# Patient Record
Sex: Female | Born: 1980 | Race: White | Hispanic: No | Marital: Single | Smoking: Current every day smoker
Health system: Southern US, Community
[De-identification: ages and names within clinical notes are randomized; demographics above are authoritative.]

## PROBLEM LIST (undated history)

## (undated) DIAGNOSIS — K2970 Gastritis, unspecified, without bleeding: Secondary | ICD-10-CM

## (undated) DIAGNOSIS — E669 Obesity, unspecified: Secondary | ICD-10-CM

## (undated) DIAGNOSIS — F419 Anxiety disorder, unspecified: Secondary | ICD-10-CM

## (undated) DIAGNOSIS — E538 Deficiency of other specified B group vitamins: Secondary | ICD-10-CM

## (undated) DIAGNOSIS — F32A Depression, unspecified: Secondary | ICD-10-CM

## (undated) DIAGNOSIS — K2270 Barrett's esophagus without dysplasia: Secondary | ICD-10-CM

## (undated) DIAGNOSIS — Z8619 Personal history of other infectious and parasitic diseases: Secondary | ICD-10-CM

## (undated) DIAGNOSIS — K219 Gastro-esophageal reflux disease without esophagitis: Secondary | ICD-10-CM

## (undated) DIAGNOSIS — E559 Vitamin D deficiency, unspecified: Secondary | ICD-10-CM

## (undated) DIAGNOSIS — B019 Varicella without complication: Secondary | ICD-10-CM

## (undated) DIAGNOSIS — F329 Major depressive disorder, single episode, unspecified: Secondary | ICD-10-CM

## (undated) DIAGNOSIS — K589 Irritable bowel syndrome without diarrhea: Secondary | ICD-10-CM

## (undated) HISTORY — PX: WISDOM TOOTH EXTRACTION: SHX21

## (undated) HISTORY — PX: COLONOSCOPY: SHX174

---

## 1999-05-13 ENCOUNTER — Inpatient Hospital Stay (HOSPITAL_COMMUNITY): Admission: AD | Admit: 1999-05-13 | Discharge: 1999-05-13 | Payer: Self-pay | Admitting: *Deleted

## 1999-05-13 ENCOUNTER — Encounter: Payer: Self-pay | Admitting: *Deleted

## 2006-04-21 ENCOUNTER — Ambulatory Visit: Payer: Self-pay | Admitting: Family Medicine

## 2006-08-11 ENCOUNTER — Ambulatory Visit: Payer: Self-pay | Admitting: Gastroenterology

## 2006-12-15 ENCOUNTER — Ambulatory Visit: Payer: Self-pay | Admitting: Family Medicine

## 2007-08-24 ENCOUNTER — Ambulatory Visit: Payer: Self-pay | Admitting: Family Medicine

## 2009-05-24 ENCOUNTER — Encounter: Payer: Self-pay | Admitting: Specialist

## 2009-07-04 ENCOUNTER — Ambulatory Visit: Payer: Self-pay | Admitting: Specialist

## 2009-07-23 ENCOUNTER — Ambulatory Visit: Payer: Self-pay | Admitting: Unknown Physician Specialty

## 2009-08-14 ENCOUNTER — Ambulatory Visit: Payer: Self-pay | Admitting: Unknown Physician Specialty

## 2010-05-21 ENCOUNTER — Ambulatory Visit: Payer: Self-pay | Admitting: Neurology

## 2013-08-12 ENCOUNTER — Emergency Department: Payer: Self-pay | Admitting: Emergency Medicine

## 2013-08-12 LAB — COMPREHENSIVE METABOLIC PANEL
Albumin: 3.5 g/dL (ref 3.4–5.0)
Alkaline Phosphatase: 71 U/L
Anion Gap: 10 (ref 7–16)
BUN: 8 mg/dL (ref 7–18)
Bilirubin,Total: 0.3 mg/dL (ref 0.2–1.0)
Calcium, Total: 8.8 mg/dL (ref 8.5–10.1)
Chloride: 104 mmol/L (ref 98–107)
Co2: 24 mmol/L (ref 21–32)
Creatinine: 1.34 mg/dL — ABNORMAL HIGH (ref 0.60–1.30)
EGFR (African American): 60 — ABNORMAL LOW
EGFR (Non-African Amer.): 52 — ABNORMAL LOW
Glucose: 149 mg/dL — ABNORMAL HIGH (ref 65–99)
Osmolality: 277 (ref 275–301)
Potassium: 3 mmol/L — ABNORMAL LOW (ref 3.5–5.1)
SGOT(AST): 29 U/L (ref 15–37)
SGPT (ALT): 22 U/L (ref 12–78)
Sodium: 138 mmol/L (ref 136–145)
Total Protein: 7.9 g/dL (ref 6.4–8.2)

## 2013-08-12 LAB — URINALYSIS, COMPLETE
Bacteria: NONE SEEN
Bilirubin,UR: NEGATIVE
Glucose,UR: NEGATIVE mg/dL (ref 0–75)
Hyaline Cast: 13
Ketone: NEGATIVE
Nitrite: NEGATIVE
Ph: 5 (ref 4.5–8.0)
Protein: 100
RBC,UR: 186 /HPF (ref 0–5)
Specific Gravity: 1.026 (ref 1.003–1.030)
Squamous Epithelial: 8
WBC UR: 86 /HPF (ref 0–5)

## 2013-08-12 LAB — CBC WITH DIFFERENTIAL/PLATELET
Basophil #: 0.1 10*3/uL (ref 0.0–0.1)
Basophil %: 0.8 %
Eosinophil #: 0.1 10*3/uL (ref 0.0–0.7)
Eosinophil %: 1.1 %
HCT: 42.3 % (ref 35.0–47.0)
HGB: 14 g/dL (ref 12.0–16.0)
Lymphocyte #: 2.3 10*3/uL (ref 1.0–3.6)
Lymphocyte %: 27.2 %
MCH: 29.9 pg (ref 26.0–34.0)
MCHC: 33.2 g/dL (ref 32.0–36.0)
MCV: 90 fL (ref 80–100)
Monocyte #: 0.6 x10 3/mm (ref 0.2–0.9)
Monocyte %: 7 %
Neutrophil #: 5.4 10*3/uL (ref 1.4–6.5)
Neutrophil %: 63.9 %
Platelet: 239 10*3/uL (ref 150–440)
RBC: 4.69 10*6/uL (ref 3.80–5.20)
RDW: 12.9 % (ref 11.5–14.5)
WBC: 8.5 10*3/uL (ref 3.6–11.0)

## 2015-05-16 ENCOUNTER — Encounter: Payer: Self-pay | Admitting: *Deleted

## 2015-05-17 ENCOUNTER — Ambulatory Visit
Admission: RE | Admit: 2015-05-17 | Discharge: 2015-05-17 | Disposition: A | Discharge status: Home / Self Care | Payer: Managed Care, Other (non HMO) | Source: Ambulatory Visit | Attending: Gastroenterology | Admitting: Gastroenterology

## 2015-05-17 ENCOUNTER — Ambulatory Visit: Payer: Managed Care, Other (non HMO) | Admitting: Anesthesiology

## 2015-05-17 ENCOUNTER — Encounter
Admission: RE | Disposition: A | Discharge status: Home / Self Care | Payer: Self-pay | Source: Ambulatory Visit | Attending: Gastroenterology

## 2015-05-17 ENCOUNTER — Encounter: Payer: Self-pay | Admitting: Anesthesiology

## 2015-05-17 DIAGNOSIS — E559 Vitamin D deficiency, unspecified: Secondary | ICD-10-CM | POA: Insufficient documentation

## 2015-05-17 DIAGNOSIS — K589 Irritable bowel syndrome without diarrhea: Secondary | ICD-10-CM | POA: Insufficient documentation

## 2015-05-17 DIAGNOSIS — K210 Gastro-esophageal reflux disease with esophagitis: Secondary | ICD-10-CM | POA: Insufficient documentation

## 2015-05-17 DIAGNOSIS — E669 Obesity, unspecified: Secondary | ICD-10-CM | POA: Insufficient documentation

## 2015-05-17 DIAGNOSIS — E538 Deficiency of other specified B group vitamins: Secondary | ICD-10-CM | POA: Diagnosis not present

## 2015-05-17 DIAGNOSIS — Z6830 Body mass index (BMI) 30.0-30.9, adult: Secondary | ICD-10-CM | POA: Insufficient documentation

## 2015-05-17 DIAGNOSIS — F329 Major depressive disorder, single episode, unspecified: Secondary | ICD-10-CM | POA: Insufficient documentation

## 2015-05-17 DIAGNOSIS — K219 Gastro-esophageal reflux disease without esophagitis: Secondary | ICD-10-CM | POA: Diagnosis present

## 2015-05-17 DIAGNOSIS — K2950 Unspecified chronic gastritis without bleeding: Secondary | ICD-10-CM | POA: Diagnosis not present

## 2015-05-17 DIAGNOSIS — K319 Disease of stomach and duodenum, unspecified: Secondary | ICD-10-CM | POA: Insufficient documentation

## 2015-05-17 DIAGNOSIS — Z79899 Other long term (current) drug therapy: Secondary | ICD-10-CM | POA: Diagnosis not present

## 2015-05-17 DIAGNOSIS — R1013 Epigastric pain: Secondary | ICD-10-CM | POA: Diagnosis not present

## 2015-05-17 HISTORY — DX: Vitamin D deficiency, unspecified: E55.9

## 2015-05-17 HISTORY — DX: Major depressive disorder, single episode, unspecified: F32.9

## 2015-05-17 HISTORY — DX: Varicella without complication: B01.9

## 2015-05-17 HISTORY — DX: Depression, unspecified: F32.A

## 2015-05-17 HISTORY — DX: Deficiency of other specified B group vitamins: E53.8

## 2015-05-17 HISTORY — DX: Obesity, unspecified: E66.9

## 2015-05-17 HISTORY — PX: ESOPHAGOGASTRODUODENOSCOPY (EGD) WITH PROPOFOL: SHX5813

## 2015-05-17 HISTORY — DX: Irritable bowel syndrome without diarrhea: K58.9

## 2015-05-17 HISTORY — DX: Gastro-esophageal reflux disease without esophagitis: K21.9

## 2015-05-17 HISTORY — DX: Anxiety disorder, unspecified: F41.9

## 2015-05-17 LAB — POCT PREGNANCY, URINE: Preg Test, Ur: NEGATIVE

## 2015-05-17 SURGERY — ESOPHAGOGASTRODUODENOSCOPY (EGD) WITH PROPOFOL
Anesthesia: General

## 2015-05-17 MED ORDER — FENTANYL CITRATE (PF) 100 MCG/2ML IJ SOLN
INTRAMUSCULAR | Status: DC | PRN
  Administered 2015-05-17: 50 ug via INTRAVENOUS

## 2015-05-17 MED ORDER — SODIUM CHLORIDE 0.9 % IV SOLN: INTRAVENOUS | Status: DC

## 2015-05-17 MED ORDER — LIDOCAINE HCL (PF) 1 % IJ SOLN
2.0000 mL | Freq: Once | INTRAMUSCULAR | Status: AC
  Administered 2015-05-17: 0.03 mL via INTRADERMAL

## 2015-05-17 MED ORDER — LIDOCAINE HCL (PF) 1 % IJ SOLN
INTRAMUSCULAR | Status: AC
  Administered 2015-05-17: 0.03 mL via INTRADERMAL
  Filled 2015-05-17: qty 2

## 2015-05-17 MED ORDER — IPRATROPIUM-ALBUTEROL 0.5-2.5 (3) MG/3ML IN SOLN
RESPIRATORY_TRACT | Status: AC
  Administered 2015-05-17: 3 mL via RESPIRATORY_TRACT
  Filled 2015-05-17: qty 3

## 2015-05-17 MED ORDER — SODIUM CHLORIDE 0.9 % IV SOLN
INTRAVENOUS | Status: DC
  Administered 2015-05-17: 1000 mL via INTRAVENOUS

## 2015-05-17 MED ORDER — PROPOFOL 10 MG/ML IV BOLUS
INTRAVENOUS | Status: DC | PRN
  Administered 2015-05-17: 50 mg via INTRAVENOUS

## 2015-05-17 MED ORDER — GLYCOPYRROLATE 0.2 MG/ML IJ SOLN
INTRAMUSCULAR | Status: DC | PRN
  Administered 2015-05-17: 0.1 mg via INTRAVENOUS

## 2015-05-17 MED ORDER — MIDAZOLAM HCL 5 MG/5ML IJ SOLN
INTRAMUSCULAR | Status: DC | PRN
  Administered 2015-05-17: 1 mg via INTRAVENOUS

## 2015-05-17 MED ORDER — IPRATROPIUM-ALBUTEROL 0.5-2.5 (3) MG/3ML IN SOLN
3.0000 mL | Freq: Once | RESPIRATORY_TRACT | Status: AC
  Administered 2015-05-17: 3 mL via RESPIRATORY_TRACT

## 2015-05-17 MED ORDER — LIDOCAINE HCL (CARDIAC) 20 MG/ML IV SOLN
INTRAVENOUS | Status: DC | PRN
  Administered 2015-05-17: 30 mg via INTRAVENOUS

## 2015-05-17 MED ORDER — PROPOFOL 500 MG/50ML IV EMUL
INTRAVENOUS | Status: DC | PRN
  Administered 2015-05-17: 150 ug/kg/min via INTRAVENOUS

## 2015-05-17 NOTE — Transfer of Care (Signed)
Immediate Anesthesia Transfer of Care Note  Patient: Samantha Yu  Procedure(s) Performed: Procedure(s): ESOPHAGOGASTRODUODENOSCOPY (EGD) WITH PROPOFOL (N/A)  Patient Location: PACU and Short Stay  Anesthesia Type:General  Level of Consciousness: awake, oriented and patient cooperative  Airway & Oxygen Therapy: Patient Spontanous Breathing and Patient connected to nasal cannula oxygen  Post-op Assessment: Report given to RN and Post -op Vital signs reviewed and stable  Post vital signs: Reviewed and stable  Last Vitals:  Filed Vitals:   05/17/15 0850  BP: 104/67  Pulse: 79  Temp: 36.8 C  Resp: 17    Complications: No apparent anesthesia complications

## 2015-05-17 NOTE — Op Note (Signed)
[facility] Gastroenterology Patient Name: Samantha Yu Procedure Date: 05/17/2015 9:32 AM MRN: [id number] Account #: [id number] Date of Birth: 1980-09-20 Admit Type: Outpatient Age: [age] [facility]OM 3 Gender: Female Note Status: Finalized Procedure:            Upper GI endoscopy Indications:          Dyspepsia, Gastro-esophageal reflux disease, Failure to                        respond to medical treatment Providers:            Christena DeemMartin U. Skulskie, MD Referring MD:         Barbette ReichmannVishwanath Hande, MD (Referring MD) Medicines:            Monitored Anesthesia Care Complications:        No immediate complications. Procedure:            Pre-Anesthesia Assessment:                       - ASA Grade Assessment: II - A patient with mild                        systemic disease.                       After obtaining informed consent, the endoscope was                        passed under direct vision. Throughout the procedure,                        the patient's blood pressure, pulse, and oxygen                        saturations were monitored continuously. The Endoscope                        was introduced through the mouth, and advanced to the                        third part of duodenum. The upper GI endoscopy was                        accomplished without difficulty. Findings:      The Z-line was variable. Biopsies were taken with a cold forceps for       histology.      The exam of the esophagus was otherwise normal.      Patchy moderate inflammation characterized by congestion (edema) and       erythema was found on the greater curvature of the gastric body, in the       gastric antrum and on the greater curvature of the gastric antrum.       Biopsies were taken with a cold forceps for histology. Biopsies were       taken with a cold forceps for Helicobacter pylori testing.      The examined duodenum was normal.      The cardia and gastric fundus were normal  on retroflexion.      - of note, the arytenoid folds are erythematous Impression:           -  Z-line variable. Biopsied.                       - Gastritis. Biopsied.                       - Normal examined duodenum. Recommendation:       - Discharge patient to home.                       - Await pathology results.                       - Use Protonix (pantoprazole) 40 mg PO daily daily.                       - Use sucralfate tablets 1 gram PO QID daily.                       - Return to GI clinic in 4 weeks. Procedure Code(s):    --- Professional ---                       [phone], Esophagogastroduodenoscopy, flexible, transoral;                        with biopsy, single or multiple Diagnosis Code(s):    --- Professional ---                       K22.8, Other specified diseases of esophagus                       K29.70, Gastritis, unspecified, without bleeding                       R10.13, Epigastric pain                       K21.9, Gastro-esophageal reflux disease without                        esophagitis CPT copyright 2016 [employer]. All rights reserved. The codes documented in this report are preliminary and upon coder review may  be revised to meet current compliance requirements. Christena Deem, MD 05/17/2015 9:54:45 AM This report has been signed electronically. Number of Addenda: 0 Note Initiated On: 05/17/2015 9:32 AM      [facility]

## 2015-05-17 NOTE — H&P (Signed)
Outpatient short stay form Pre-procedure 05/17/2015 9:26 AM Samantha Yu Jenkins Risdon MD  Primary Physician: Dr Barbette ReichmannVishwanath Hande  Reason for visit:  EGD  History of present illness:  Samantha Yu is a 35 year old female presenting today for EGD. She has been treated for gastroesophageal reflux for quite some time and has found that 40 mg a day of Protonix is no longer adequate. Having nighttime waking with acid regurgitation and epigastric burning. He has been trying to take some Protonix 40 mg twice a day. She's never had an EGD. He is been some mild weight loss.    Current facility-administered medications:  .  0.9 %  sodium chloride infusion, , Intravenous, Continuous, Samantha Yu Darol Cush, MD, Last Rate: 20 mL/hr at 05/17/15 0914, 1,000 mL at 05/17/15 0914 .  0.9 %  sodium chloride infusion, , Intravenous, Continuous, Samantha Yu Strider Vallance, MD  Prescriptions prior to admission  Medication Sig Dispense Refill Last Dose  . acetaminophen (TYLENOL) 100 MG/ML solution Take 10 mg/kg by mouth every 4 (four) hours as needed for fever.     . cetirizine (ZYRTEC) 10 MG tablet Take 10 mg by mouth daily.     . colchicine 0.6 MG tablet Take 0.6 mg by mouth daily.     Marland Kitchen. etonogestrel (NEXPLANON) 68 MG IMPL implant 1 each by Subdermal route once.     Marland Kitchen. LORazepam (ATIVAN) 0.5 MG tablet Take 0.5 mg by mouth every 8 (eight) hours.     . pantoprazole (PROTONIX) 40 MG tablet Take 40 mg by mouth daily.     . sertraline (ZOLOFT) 100 MG tablet Take 100 mg by mouth daily.     . traMADol (ULTRAM) 50 MG tablet Take 50 mg by mouth 2 (two) times daily.     . Vitamin D, Ergocalciferol, (DRISDOL) 50000 units CAPS capsule Take 50,000 Units by mouth every 7 (seven) days.        Allergies  Allergen Reactions  . Cheratussin Ac [Guaifenesin-Codeine]   . Lyrica [Pregabalin]   . Nsaids      Past Medical History  Diagnosis Date  . Anxiety   . Depression   . GERD (gastroesophageal reflux disease)   . IBS (irritable bowel syndrome)    . Obesity   . Chicken pox   . Vitamin D deficiency   . Vitamin B 12 deficiency     Review of systems:      Physical Exam    Heart and lungs: Regular rate and rhythm without rub or gallop, lungs are bilaterally clear.    HEENT: Normocephalic atraumatic eyes are    Other:     Pertinant exam for procedure: Soft, tender to palpation in the epigastric region. No masses rebound or organomegaly. Bowel sounds are positive.    Planned proceedures: EGD and indicated procedures. I have discussed the risks benefits and complications of procedures to include not limited to bleeding, infection, perforation and the risk of sedation and the patient wishes to proceed.    Samantha Yu Rikki Smestad, MD Gastroenterology 05/17/2015  9:26 AM

## 2015-05-17 NOTE — Anesthesia Preprocedure Evaluation (Signed)
Anesthesia Evaluation  Patient identified by MRN, date of birth, ID band Patient awake    Reviewed: Allergy & Precautions, H&P , NPO status , Patient's Chart, lab work & pertinent test results  History of Anesthesia Complications Negative for: history of anesthetic complications  Airway Mallampati: III  TM Distance: >3 FB Neck ROM: full    Dental  (+) Poor Dentition, Chipped   Pulmonary neg shortness of breath, Current Smoker,    Pulmonary exam normal breath sounds clear to auscultation       Cardiovascular Exercise Tolerance: Good (-) angina(-) Past MI and (-) DOE negative cardio ROS Normal cardiovascular exam Rhythm:regular Rate:Normal     Neuro/Psych PSYCHIATRIC DISORDERS Anxiety Depression negative neurological ROS     GI/Hepatic Neg liver ROS, GERD  Controlled,  Endo/Other  negative endocrine ROS  Renal/GU negative Renal ROS  negative genitourinary   Musculoskeletal negative musculoskeletal ROS (+)   Abdominal   Peds negative pediatric ROS (+)  Hematology negative hematology ROS (+)   Anesthesia Other Findings Past Medical History:   Anxiety                                                      Depression                                                   GERD (gastroesophageal reflux disease)                       IBS (irritable bowel syndrome)                               Obesity                                                      Chicken pox                                                  Vitamin D deficiency                                         Vitamin B 12 deficiency                                     Past Surgical History:   WISDOM TOOTH EXTRACTION                                       COLONOSCOPY  BMI    Body Mass Index   30.88 kg/m 2      Reproductive/Obstetrics negative OB ROS                              Anesthesia Physical Anesthesia Plan  ASA: III  Anesthesia Plan: General   Post-op Pain Management:    Induction:   Airway Management Planned:   Additional Equipment:   Intra-op Plan:   Post-operative Plan:   Informed Consent: I have reviewed the patients History and Physical, chart, labs and discussed the procedure including the risks, benefits and alternatives for the proposed anesthesia with the patient or authorized representative who has indicated his/her understanding and acceptance.   Dental Advisory Given  Plan Discussed with: Anesthesiologist, CRNA and Surgeon  Anesthesia Plan Comments:         Anesthesia Quick Evaluation

## 2015-05-17 NOTE — Anesthesia Postprocedure Evaluation (Signed)
Anesthesia Post Note  Patient: Samantha Yu  Procedure(s) Performed: Procedure(s) (LRB): ESOPHAGOGASTRODUODENOSCOPY (EGD) WITH PROPOFOL (N/A)  Patient location during evaluation: Endoscopy Anesthesia Type: General Level of consciousness: awake and alert Pain management: pain level controlled Vital Signs Assessment: post-procedure vital signs reviewed and stable Respiratory status: spontaneous breathing, nonlabored ventilation, respiratory function stable and patient connected to nasal cannula oxygen Cardiovascular status: blood pressure returned to baseline and stable Postop Assessment: no signs of nausea or vomiting Anesthetic complications: no    Last Vitals:  Filed Vitals:   05/17/15 1020 05/17/15 1030  BP: 107/62 96/66  Pulse: 83 79  Temp:    Resp: 22 18    Last Pain: There were no vitals filed for this visit.               Cleda MccreedyJoseph K Glenice Ciccone

## 2015-05-20 ENCOUNTER — Encounter: Payer: Self-pay | Admitting: Gastroenterology

## 2015-05-20 LAB — SURGICAL PATHOLOGY

## 2015-05-24 IMAGING — CR DG ABDOMEN 1V
1 series · 2 of 2 positions shown · non-contrast
Comparison: None.

CLINICAL DATA: 33-year-old female with abdominal pain and
constipation. Initial encounter.

EXAM:
ABDOMEN - 1 VIEW

[Series 1: t abdomen supine · 0.14mm/px · 2 of 2 slices shown]
[im 1/2]
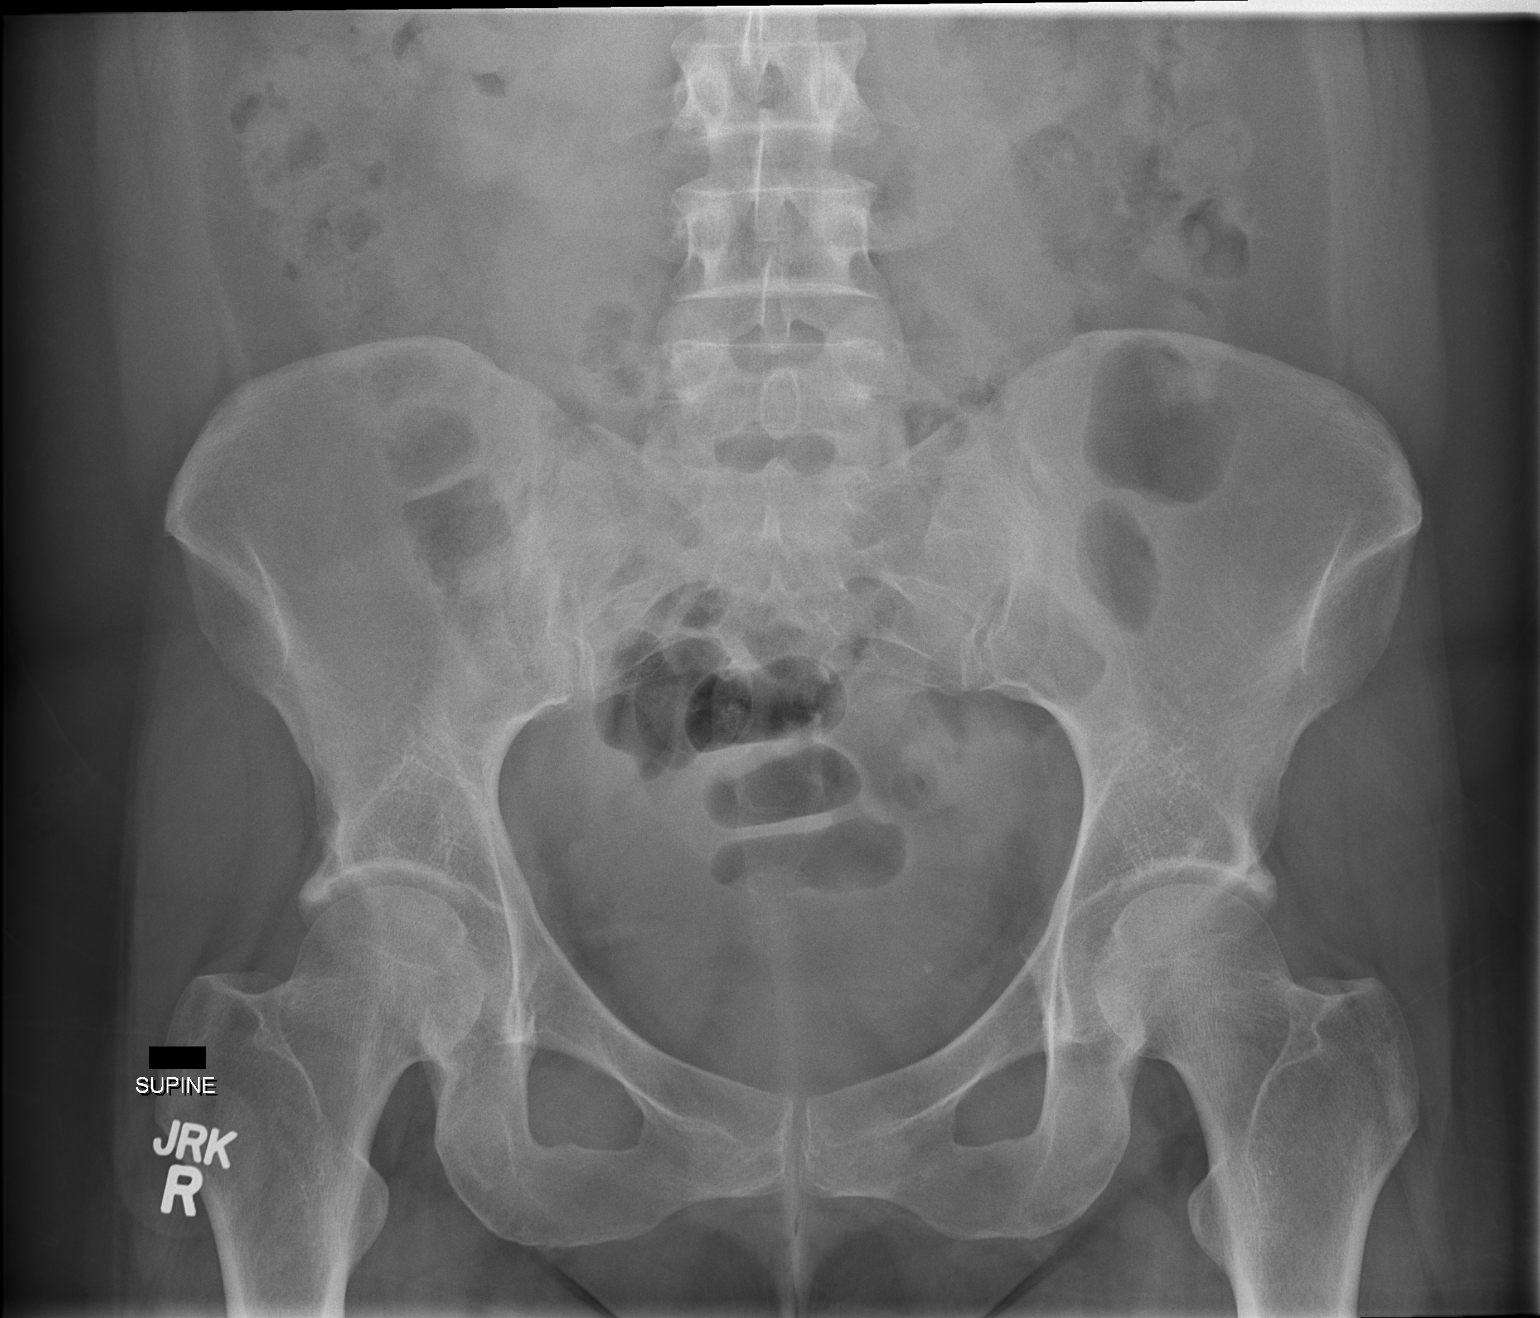
[im 2/2]
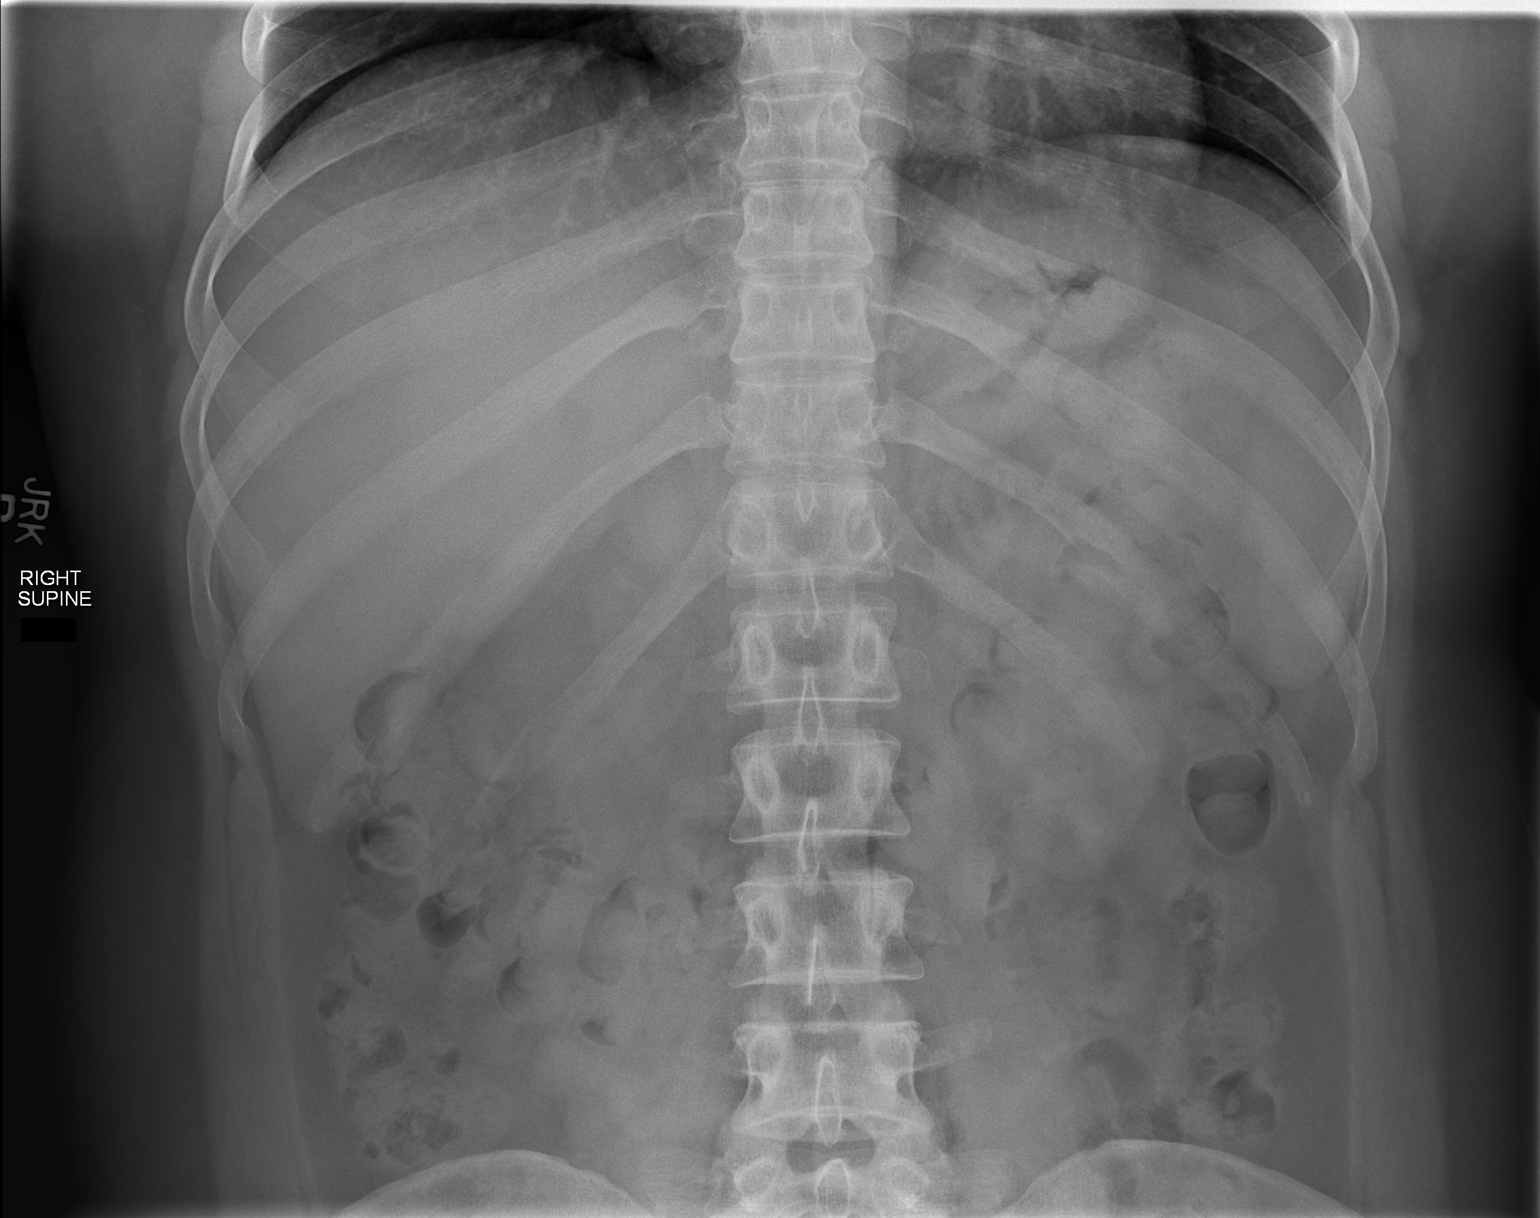

[2 of 2 positions shown; findings below may reference images not displayed]

FINDINGS: Supine views of the abdomen and pelvis. Non obstructed bowel gas
pattern. Mild volume of retained stool in the proximal colon.
Negative visible lung bases. Abdominal and pelvic visceral contours
are within normal limits. Small left hemipelvis phlebolith. No
osseous abnormality identified. No definite pneumoperitoneum on
these supine views.
IMPRESSION: Negative.  Non obstructed bowel gas pattern.

## 2016-04-08 ENCOUNTER — Encounter: Payer: Self-pay | Admitting: Emergency Medicine

## 2016-04-08 ENCOUNTER — Emergency Department
Admission: EM | Admit: 2016-04-08 | Discharge: 2016-04-08 | Disposition: A | Discharge status: Home / Self Care | Payer: Managed Care, Other (non HMO) | Attending: Emergency Medicine | Admitting: Emergency Medicine

## 2016-04-08 DIAGNOSIS — F17200 Nicotine dependence, unspecified, uncomplicated: Secondary | ICD-10-CM | POA: Insufficient documentation

## 2016-04-08 DIAGNOSIS — K529 Noninfective gastroenteritis and colitis, unspecified: Secondary | ICD-10-CM | POA: Diagnosis not present

## 2016-04-08 DIAGNOSIS — R1033 Periumbilical pain: Secondary | ICD-10-CM | POA: Diagnosis present

## 2016-04-08 LAB — COMPREHENSIVE METABOLIC PANEL
ALT: 23 U/L (ref 14–54)
AST: 24 U/L (ref 15–41)
Albumin: 4.4 g/dL (ref 3.5–5.0)
Alkaline Phosphatase: 85 U/L (ref 38–126)
Anion gap: 10 (ref 5–15)
BILIRUBIN TOTAL: 0.6 mg/dL (ref 0.3–1.2)
BUN: 13 mg/dL (ref 6–20)
CO2: 21 mmol/L — ABNORMAL LOW (ref 22–32)
Calcium: 9.3 mg/dL (ref 8.9–10.3)
Chloride: 107 mmol/L (ref 101–111)
Creatinine, Ser: 0.84 mg/dL (ref 0.44–1.00)
Glucose, Bld: 88 mg/dL (ref 65–99)
POTASSIUM: 3.7 mmol/L (ref 3.5–5.1)
Sodium: 138 mmol/L (ref 135–145)
TOTAL PROTEIN: 8.1 g/dL (ref 6.5–8.1)

## 2016-04-08 LAB — CBC
HEMATOCRIT: 41.9 % (ref 35.0–47.0)
Hemoglobin: 14.7 g/dL (ref 12.0–16.0)
MCH: 31.5 pg (ref 26.0–34.0)
MCHC: 35.1 g/dL (ref 32.0–36.0)
MCV: 89.7 fL (ref 80.0–100.0)
Platelets: 255 10*3/uL (ref 150–440)
RBC: 4.67 MIL/uL (ref 3.80–5.20)
RDW: 12.5 % (ref 11.5–14.5)
WBC: 9.4 10*3/uL (ref 3.6–11.0)

## 2016-04-08 LAB — URINALYSIS, COMPLETE (UACMP) WITH MICROSCOPIC
BACTERIA UA: NONE SEEN
BILIRUBIN URINE: NEGATIVE
Glucose, UA: NEGATIVE mg/dL
Hgb urine dipstick: NEGATIVE
Ketones, ur: 20 mg/dL — AB
Nitrite: NEGATIVE
PH: 5 (ref 5.0–8.0)
Protein, ur: 30 mg/dL — AB
SPECIFIC GRAVITY, URINE: 1.03 (ref 1.005–1.030)

## 2016-04-08 LAB — LIPASE, BLOOD: Lipase: 17 U/L (ref 11–51)

## 2016-04-08 LAB — POCT PREGNANCY, URINE: Preg Test, Ur: NEGATIVE

## 2016-04-08 MED ORDER — SODIUM CHLORIDE 0.9 % IV BOLUS (SEPSIS)
1000.0000 mL | Freq: Once | INTRAVENOUS | Status: AC
  Administered 2016-04-08: 1000 mL via INTRAVENOUS

## 2016-04-08 MED ORDER — DICYCLOMINE HCL 10 MG/ML IM SOLN
20.0000 mg | Freq: Once | INTRAMUSCULAR | Status: AC
Start: 2016-04-08 — End: 2016-04-08
  Administered 2016-04-08: 20 mg via INTRAMUSCULAR
  Filled 2016-04-08: qty 2

## 2016-04-08 MED ORDER — PROCHLORPERAZINE EDISYLATE 5 MG/ML IJ SOLN
INTRAMUSCULAR | Status: AC
  Administered 2016-04-08: 10 mg via INTRAVENOUS
  Filled 2016-04-08: qty 2

## 2016-04-08 MED ORDER — PROCHLORPERAZINE EDISYLATE 5 MG/ML IJ SOLN
10.0000 mg | Freq: Once | INTRAMUSCULAR | Status: AC
  Administered 2016-04-08: 10 mg via INTRAVENOUS

## 2016-04-08 MED ORDER — DICYCLOMINE HCL 20 MG PO TABS: 20.0000 mg | ORAL_TABLET | Freq: Three times a day (TID) | ORAL | 0 refills | Status: AC | PRN

## 2016-04-08 MED ORDER — PROCHLORPERAZINE MALEATE 10 MG PO TABS: 10.0000 mg | ORAL_TABLET | Freq: Three times a day (TID) | ORAL | 0 refills | Status: AC | PRN

## 2016-04-08 NOTE — ED Triage Notes (Signed)
Pt comes into the ED via POV c/o abdominal pain around the umbilicus.  Patient states the pain started Monday and has progressively gotten worse.  Patient vomited on Monday but none since then.  States she has had loose stools the entire time.  Denies chest pain, shortness of breath, or dizziness.  Patient tearful upon assessment.

## 2016-04-08 NOTE — ED Provider Notes (Signed)
[facility] Emergency Department Provider Note  ____________________________________________   I have reviewed the triage vital signs and the nursing notes.   HISTORY  Chief Complaint Abdominal Pain   History limited by: Not Limited   HPI Samantha Yu is a 36 y.o. female who presents to the emergency department today because of abdominal pain. It is located periumbilical. It started 2 days ago. It is severe. It is sharp. It is intermittent. It is worse with eating. It has been accompanied by some diarrhea. She has had some nausea and only one episode of emesis. Both emesis and diarrhea are nonbloody. No fevers but feels like she has had an occasional chill. In addition the patient has been complaining of headache.    Past Medical History:  Diagnosis Date  . Anxiety   . Chicken pox   . Depression   . GERD (gastroesophageal reflux disease)   . IBS (irritable bowel syndrome)   . Obesity   . Vitamin B 12 deficiency   . Vitamin D deficiency     There are no active problems to display for this patient.   Past Surgical History:  Procedure Laterality Date  . COLONOSCOPY    . ESOPHAGOGASTRODUODENOSCOPY (EGD) WITH PROPOFOL N/A 05/17/2015   Procedure: ESOPHAGOGASTRODUODENOSCOPY (EGD) WITH PROPOFOL;  Surgeon: Christena Deem, MD;  Location: [facility] ENDOSCOPY;  Service: Endoscopy;  Laterality: N/A;  . WISDOM TOOTH EXTRACTION      Prior to Admission medications   Medication Sig Start Date End Date Taking? Authorizing Provider  acetaminophen (TYLENOL) 100 MG/ML solution Take 10 mg/kg by mouth every 4 (four) hours as needed for fever.    Historical Provider, MD  cetirizine (ZYRTEC) 10 MG tablet Take 10 mg by mouth daily.    Historical Provider, MD  colchicine 0.6 MG tablet Take 0.6 mg by mouth daily.    Historical Provider, MD  etonogestrel (NEXPLANON) 68 MG IMPL implant 1 each by Subdermal route once.    Historical Provider, MD  LORazepam (ATIVAN) 0.5 MG  tablet Take 0.5 mg by mouth every 8 (eight) hours.    Historical Provider, MD  pantoprazole (PROTONIX) 40 MG tablet Take 40 mg by mouth daily.    Historical Provider, MD  sertraline (ZOLOFT) 100 MG tablet Take 100 mg by mouth daily.    Historical Provider, MD  traMADol (ULTRAM) 50 MG tablet Take 50 mg by mouth 2 (two) times daily.    Historical Provider, MD  Vitamin D, Ergocalciferol, (DRISDOL) 50000 units CAPS capsule Take 50,000 Units by mouth every 7 (seven) days.    Historical Provider, MD    Allergies Cheratussin ac [guaifenesin-codeine]; Lyrica [pregabalin]; and Nsaids  No family history on file.  Social History Social History  Substance Use Topics  . Smoking status: Current Every Day Smoker  . Smokeless tobacco: Never Used  . Alcohol use No    Review of Systems  Constitutional: Negative for fever. Cardiovascular: Negative for chest pain. Respiratory: Negative for shortness of breath. Gastrointestinal: Positive for abdominal pain and diarrhea. One episode of emesis.  Neurological: Negative for headaches, focal weakness or numbness.  10-point ROS otherwise negative.  ____________________________________________   PHYSICAL EXAM:  VITAL SIGNS: ED Triage Vitals  Enc Vitals Group     BP 04/08/16 1816 134/68     Pulse Rate 04/08/16 1816 (!) 105     Resp 04/08/16 1816 (!) 22     Temp 04/08/16 1816 98.5 F (36.9 C)     Temp Source 04/08/16 1816 Oral  SpO2 04/08/16 1816 98 %     Weight 04/08/16 1816 183 lb (83 kg)     Height 04/08/16 1816 5\' 5"  (1.651 m)     Head Circumference --      Peak Flow --      Pain Score 04/08/16 1817 8    Constitutional: Alert and oriented. Appears uncomfortable.  Eyes: Conjunctivae are normal. Normal extraocular movements. ENT   Head: Normocephalic and atraumatic.   Nose: No congestion/rhinnorhea.   Mouth/Throat: Mucous membranes are moist.   Neck: No stridor. Hematological/Lymphatic/Immunilogical: No cervical  lymphadenopathy. Cardiovascular: Normal rate, regular rhythm.  No murmurs, rubs, or gallops.  Respiratory: Normal respiratory effort without tachypnea nor retractions. Breath sounds are clear and equal bilaterally. No wheezes/rales/rhonchi. Gastrointestinal: Soft. Slightly tender to palpation in the periumbilical region. No rebound. No guarding.  Genitourinary: Deferred Musculoskeletal: Normal range of motion in all extremities. No lower extremity edema. Neurologic:  Normal speech and language. No gross focal neurologic deficits are appreciated.  Skin:  Skin is warm, dry and intact. No rash noted. Psychiatric: Mood and affect are normal. Speech and behavior are normal. Patient exhibits appropriate insight and judgment.  ____________________________________________    LABS (pertinent positives/negatives)  Labs Reviewed  COMPREHENSIVE METABOLIC PANEL - Abnormal; Notable for the following:       Result Value   CO2 21 (*)    All other components within normal limits  URINALYSIS, COMPLETE (UACMP) WITH MICROSCOPIC - Abnormal; Notable for the following:    Color, Urine YELLOW (*)    APPearance CLOUDY (*)    Ketones, ur 20 (*)    Protein, ur 30 (*)    Leukocytes, UA TRACE (*)    Squamous Epithelial / LPF 6-30 (*)    All other components within normal limits  LIPASE, BLOOD  CBC  POC URINE PREG, ED  POCT PREGNANCY, URINE     ____________________________________________   EKG  None  ____________________________________________    RADIOLOGY  None  ____________________________________________   PROCEDURES  Procedures  ____________________________________________   INITIAL IMPRESSION / ASSESSMENT AND PLAN / ED COURSE  Pertinent labs & imaging results that were available during my care of the patient were reviewed by me and considered in my medical decision making (see chart for details).  Patient presented to the emergency department today because primary concerns  for abdominal pain. She also had some headache. Blood work without any concerning findings. On exam patient with only some slight tenderness in periumbilical region. No tenderness in the right lower quadrant. Patient did feel better after IV fluids, bentyl and compazine. Will plan on giving prescription for compazine and bentyl.   ____________________________________________   FINAL CLINICAL IMPRESSION(S) / ED DIAGNOSES  Final diagnoses:  Gastroenteritis     Note: This dictation was prepared with Dragon dictation. Any transcriptional errors that result from this process are unintentional     Phineas SemenGraydon Dalal Livengood, MD 04/08/16 2227

## 2016-04-08 NOTE — Discharge Instructions (Signed)
Please seek medical attention for any high fevers, chest pain, shortness of breath, change in behavior, persistent vomiting, bloody stool or any other new or concerning symptoms.  

## 2016-08-07 ENCOUNTER — Ambulatory Visit
Admission: RE | Admit: 2016-08-07 | Payer: Managed Care, Other (non HMO) | Source: Ambulatory Visit | Admitting: Gastroenterology

## 2016-08-07 ENCOUNTER — Encounter: Admission: RE | Payer: Self-pay | Source: Ambulatory Visit

## 2016-08-07 SURGERY — ESOPHAGOGASTRODUODENOSCOPY (EGD) WITH PROPOFOL
Anesthesia: General

## 2017-12-15 ENCOUNTER — Encounter: Payer: Self-pay | Admitting: *Deleted

## 2017-12-16 ENCOUNTER — Ambulatory Visit: Payer: 59 | Admitting: Anesthesiology

## 2017-12-16 ENCOUNTER — Encounter: Payer: Self-pay | Admitting: Anesthesiology

## 2017-12-16 ENCOUNTER — Ambulatory Visit
Admission: RE | Admit: 2017-12-16 | Discharge: 2017-12-16 | Disposition: A | Discharge status: Home / Self Care | Payer: 59 | Source: Ambulatory Visit | Attending: Gastroenterology | Admitting: Gastroenterology

## 2017-12-16 ENCOUNTER — Encounter
Admission: RE | Disposition: A | Discharge status: Home / Self Care | Payer: Self-pay | Source: Ambulatory Visit | Attending: Gastroenterology

## 2017-12-16 DIAGNOSIS — K224 Dyskinesia of esophagus: Secondary | ICD-10-CM | POA: Diagnosis not present

## 2017-12-16 DIAGNOSIS — F329 Major depressive disorder, single episode, unspecified: Secondary | ICD-10-CM | POA: Diagnosis not present

## 2017-12-16 DIAGNOSIS — K449 Diaphragmatic hernia without obstruction or gangrene: Secondary | ICD-10-CM | POA: Diagnosis not present

## 2017-12-16 DIAGNOSIS — K2270 Barrett's esophagus without dysplasia: Secondary | ICD-10-CM | POA: Insufficient documentation

## 2017-12-16 DIAGNOSIS — K2950 Unspecified chronic gastritis without bleeding: Secondary | ICD-10-CM | POA: Insufficient documentation

## 2017-12-16 DIAGNOSIS — Z886 Allergy status to analgesic agent status: Secondary | ICD-10-CM | POA: Diagnosis not present

## 2017-12-16 DIAGNOSIS — Z885 Allergy status to narcotic agent status: Secondary | ICD-10-CM | POA: Diagnosis not present

## 2017-12-16 DIAGNOSIS — K219 Gastro-esophageal reflux disease without esophagitis: Secondary | ICD-10-CM | POA: Diagnosis not present

## 2017-12-16 DIAGNOSIS — K210 Gastro-esophageal reflux disease with esophagitis: Secondary | ICD-10-CM | POA: Insufficient documentation

## 2017-12-16 DIAGNOSIS — F419 Anxiety disorder, unspecified: Secondary | ICD-10-CM | POA: Diagnosis not present

## 2017-12-16 DIAGNOSIS — Z79899 Other long term (current) drug therapy: Secondary | ICD-10-CM | POA: Diagnosis not present

## 2017-12-16 DIAGNOSIS — K589 Irritable bowel syndrome without diarrhea: Secondary | ICD-10-CM | POA: Insufficient documentation

## 2017-12-16 DIAGNOSIS — E559 Vitamin D deficiency, unspecified: Secondary | ICD-10-CM | POA: Insufficient documentation

## 2017-12-16 HISTORY — DX: Gastritis, unspecified, without bleeding: K29.70

## 2017-12-16 HISTORY — PX: ESOPHAGOGASTRODUODENOSCOPY (EGD) WITH PROPOFOL: SHX5813

## 2017-12-16 HISTORY — DX: Barrett's esophagus without dysplasia: K22.70

## 2017-12-16 HISTORY — DX: Personal history of other infectious and parasitic diseases: Z86.19

## 2017-12-16 LAB — POCT PREGNANCY, URINE: Preg Test, Ur: NEGATIVE

## 2017-12-16 SURGERY — ESOPHAGOGASTRODUODENOSCOPY (EGD) WITH PROPOFOL
Anesthesia: General

## 2017-12-16 MED ORDER — LIDOCAINE HCL (PF) 1 % IJ SOLN
2.0000 mL | Freq: Once | INTRAMUSCULAR | Status: AC
  Administered 2017-12-16: 0.3 mL via INTRADERMAL

## 2017-12-16 MED ORDER — FENTANYL CITRATE (PF) 100 MCG/2ML IJ SOLN
INTRAMUSCULAR | Status: DC | PRN
  Administered 2017-12-16: 25 ug via INTRAVENOUS
  Administered 2017-12-16: 50 ug via INTRAVENOUS

## 2017-12-16 MED ORDER — FENTANYL CITRATE (PF) 100 MCG/2ML IJ SOLN
INTRAMUSCULAR | Status: AC
  Filled 2017-12-16: qty 2

## 2017-12-16 MED ORDER — PROPOFOL 500 MG/50ML IV EMUL
INTRAVENOUS | Status: AC
  Filled 2017-12-16: qty 50

## 2017-12-16 MED ORDER — PROPOFOL 10 MG/ML IV BOLUS
INTRAVENOUS | Status: DC | PRN
  Administered 2017-12-16: 30 mg via INTRAVENOUS
  Administered 2017-12-16 (×3): 20 mg via INTRAVENOUS
  Administered 2017-12-16: 30 mg via INTRAVENOUS
  Administered 2017-12-16 (×2): 20 mg via INTRAVENOUS
  Administered 2017-12-16: 30 mg via INTRAVENOUS
  Administered 2017-12-16: 20 mg via INTRAVENOUS
  Administered 2017-12-16: 30 mg via INTRAVENOUS
  Administered 2017-12-16 (×4): 20 mg via INTRAVENOUS
  Administered 2017-12-16: 30 mg via INTRAVENOUS

## 2017-12-16 MED ORDER — LIDOCAINE HCL (PF) 1 % IJ SOLN
INTRAMUSCULAR | Status: AC
  Administered 2017-12-16: 0.3 mL via INTRADERMAL
  Filled 2017-12-16: qty 2

## 2017-12-16 MED ORDER — SODIUM CHLORIDE 0.9 % IV SOLN: INTRAVENOUS | Status: DC

## 2017-12-16 MED ORDER — SODIUM CHLORIDE 0.9 % IV SOLN
INTRAVENOUS | Status: DC
  Administered 2017-12-16: 1000 mL via INTRAVENOUS

## 2017-12-16 NOTE — Transfer of Care (Signed)
Immediate Anesthesia Transfer of Care Note  Patient: Samantha Yu  Procedure(s) Performed: ESOPHAGOGASTRODUODENOSCOPY (EGD) WITH PROPOFOL (N/A )  Patient Location: PACU  Anesthesia Type:General  Level of Consciousness: awake and alert   Airway & Oxygen Therapy: Patient Spontanous Breathing and Patient connected to nasal cannula oxygen  Post-op Assessment: Report given to RN and Post -op Vital signs reviewed and stable  Post vital signs: Reviewed and stable  Last Vitals:  Vitals Value Taken Time  BP 102/57 12/16/2017 10:00 AM  Temp 36.2 C 12/16/2017  9:54 AM  Pulse 73 12/16/2017 10:01 AM  Resp 17 12/16/2017 10:01 AM  SpO2 99 % 12/16/2017 10:01 AM  Vitals shown include unvalidated device data.  Last Pain:  Vitals:   12/16/17 1000  TempSrc:   PainSc: 0-No pain         Complications: No apparent anesthesia complications

## 2017-12-16 NOTE — H&P (Signed)
Outpatient short stay form Pre-procedure 12/16/2017 9:03 AM Samantha Sails MD  Primary Physician: Dr Tracie Harrier  Reason for visit: EGD  History of present illness: Patient is a 37 year old female presenting today as above.  She has a personal history of Barrett's esophagus.  She is presenting today for recheck.  She does take Protonix 40 mg daily.  He has been prescribed this twice a day but does not take it regularly twice a day.  She does have symptoms of low retrosternal discomfort as well as epigastric and right upper quadrant discomfort.  At times there will be a bloating sensation in the right upper quadrant with some pain that radiates toward her back.  She knows of no family history of gallbladder issues.  Do not seem to be trigger foods.  sHe denies any dysphagia.  She takes no aspirin or blood thinning agent.  She may occasionally take ibuprofen.    Current Facility-Administered Medications:  .  0.9 %  sodium chloride infusion, , Intravenous, Continuous, Samantha Sails, MD .  0.9 %  sodium chloride infusion, , Intravenous, Continuous, Samantha Sails, MD .  lidocaine (PF) (XYLOCAINE) 1 % injection 2 mL, 2 mL, Intradermal, Once, Samantha Sails, MD  Medications Prior to Admission  Medication Sig Dispense Refill Last Dose  . ALPRAZolam (XANAX) 0.5 MG tablet Take 0.5 mg by mouth at bedtime as needed for anxiety.     . Cyanocobalamin 1000 MCG/ML KIT Inject 1,000 mcg as directed every 30 (thirty) days.     . cyclobenzaprine (FLEXERIL) 5 MG tablet Take 5 mg by mouth 3 (three) times daily as needed for muscle spasms.     . ondansetron (ZOFRAN) 4 MG tablet Take 4 mg by mouth every 6 (six) hours as needed for nausea or vomiting.     . traZODone (DESYREL) 50 MG tablet Take 50 mg by mouth at bedtime.     Marland Kitchen acetaminophen (TYLENOL) 100 MG/ML solution Take 10 mg/kg by mouth every 4 (four) hours as needed for fever.     . cetirizine (ZYRTEC) 10 MG tablet Take 10 mg by mouth  daily.     . colchicine 0.6 MG tablet Take 0.6 mg by mouth daily.     Marland Kitchen dicyclomine (BENTYL) 20 MG tablet Take 1 tablet (20 mg total) by mouth 3 (three) times daily as needed (abdominal pain). 30 tablet 0   . etonogestrel (NEXPLANON) 68 MG IMPL implant 1 each by Subdermal route once.     Marland Kitchen LORazepam (ATIVAN) 0.5 MG tablet Take 0.5 mg by mouth every 8 (eight) hours.     . pantoprazole (PROTONIX) 40 MG tablet Take 40 mg by mouth daily.     . prochlorperazine (COMPAZINE) 10 MG tablet Take 1 tablet (10 mg total) by mouth every 8 (eight) hours as needed for nausea (headache). 20 tablet 0   . sertraline (ZOLOFT) 100 MG tablet Take 100 mg by mouth daily.     . traMADol (ULTRAM) 50 MG tablet Take 50 mg by mouth 2 (two) times daily.     . Vitamin D, Ergocalciferol, (DRISDOL) 50000 units CAPS capsule Take 50,000 Units by mouth every 7 (seven) days.        Allergies  Allergen Reactions  . Cheratussin Ac [Guaifenesin-Codeine]   . Lyrica [Pregabalin]   . Nsaids      Past Medical History:  Diagnosis Date  . Anxiety   . Barrett's esophagus   . Chicken pox   . Depression   .  Gastritis   . GERD (gastroesophageal reflux disease)   . History of chickenpox   . IBS (irritable bowel syndrome)   . Obesity   . Obesity   . Vitamin B 12 deficiency   . Vitamin D deficiency     Review of systems:      Physical Exam    Heart and lungs: Him without rub or gallop, lungs are bilaterally clear.    HEENT: Normocephalic atraumatic eyes are anicteric    Other:    Pertinant exam for procedure: Soft tender nondistended bowel sounds positive normoactive    Planned proceedures: EGD and indicated procedures. I have discussed the risks benefits and complications of procedures to include not limited to bleeding, infection, perforation and the risk of sedation and the patient wishes to proceed.    Samantha Sails, MD Gastroenterology 12/16/2017  9:03 AM

## 2017-12-16 NOTE — Anesthesia Preprocedure Evaluation (Addendum)
Anesthesia Evaluation  Patient identified by MRN, date of birth, ID band Patient awake    Reviewed: Allergy & Precautions, H&P , NPO status , Patient's Chart, lab work & pertinent test results, reviewed documented beta blocker date and time   Airway Mallampati: II   Neck ROM: full    Dental  (+) Poor Dentition, Teeth Intact   Pulmonary neg pulmonary ROS, Current Smoker,    Pulmonary exam normal        Cardiovascular Exercise Tolerance: Good negative cardio ROS Normal cardiovascular exam Rhythm:regular Rate:Normal     Neuro/Psych PSYCHIATRIC DISORDERS Anxiety Depression negative neurological ROS  negative psych ROS   GI/Hepatic negative GI ROS, Neg liver ROS, GERD  ,  Endo/Other  negative endocrine ROS  Renal/GU negative Renal ROS  negative genitourinary   Musculoskeletal   Abdominal   Peds  Hematology negative hematology ROS (+)   Anesthesia Other Findings Past Medical History: No date: Anxiety No date: Barrett's esophagus No date: Chicken pox No date: Depression No date: Gastritis No date: GERD (gastroesophageal reflux disease) No date: History of chickenpox No date: IBS (irritable bowel syndrome) No date: Obesity No date: Obesity No date: Vitamin B 12 deficiency No date: Vitamin D deficiency Past Surgical History: No date: COLONOSCOPY 05/17/2015: ESOPHAGOGASTRODUODENOSCOPY (EGD) WITH PROPOFOL; N/A     Comment:  Procedure: ESOPHAGOGASTRODUODENOSCOPY (EGD) WITH               PROPOFOL;  Surgeon: Christena Deem, MD;  Location:               [facility] ENDOSCOPY;  Service: Endoscopy;  Laterality: N/A; No date: WISDOM TOOTH EXTRACTION   Reproductive/Obstetrics negative OB ROS                            Anesthesia Physical Anesthesia Plan  ASA: III  Anesthesia Plan: General   Post-op Pain Management:    Induction:   PONV Risk Score and Plan:   Airway Management Planned:    Additional Equipment:   Intra-op Plan:   Post-operative Plan:   Informed Consent: I have reviewed the patients History and Physical, chart, labs and discussed the procedure including the risks, benefits and alternatives for the proposed anesthesia with the patient or authorized representative who has indicated his/her understanding and acceptance.   Dental Advisory Given  Plan Discussed with: CRNA  Anesthesia Plan Comments:         Anesthesia Quick Evaluation

## 2017-12-16 NOTE — Anesthesia Postprocedure Evaluation (Signed)
Anesthesia Post Note  Patient: Samantha Yu  Procedure(s) Performed: ESOPHAGOGASTRODUODENOSCOPY (EGD) WITH PROPOFOL (N/A )  Patient location during evaluation: PACU Anesthesia Type: General Level of consciousness: awake and alert Pain management: pain level controlled Vital Signs Assessment: post-procedure vital signs reviewed and stable Respiratory status: spontaneous breathing, nonlabored ventilation, respiratory function stable and patient connected to nasal cannula oxygen Cardiovascular status: blood pressure returned to baseline and stable Postop Assessment: no apparent nausea or vomiting Anesthetic complications: no     Last Vitals:  Vitals:   12/16/17 0954 12/16/17 1000  BP: (!) 102/57 (!) 102/57  Pulse: 71 69  Resp: 15 14  Temp: (!) 36.2 C   SpO2: 100% 99%    Last Pain:  Vitals:   12/16/17 1000  TempSrc:   PainSc: 0-No pain                 Yevette Edwards

## 2017-12-16 NOTE — Anesthesia Post-op Follow-up Note (Signed)
Anesthesia QCDR form completed.        

## 2017-12-16 NOTE — Op Note (Signed)
[facility] Gastroenterology Patient Name: Samantha Yu Procedure Date: 12/16/2017 8:55 AM MRN: [id number] Account #: [id number] Date of Birth: 05/20/1980 Admit Type: Outpatient Age: 37 Room: [facility] ENDO ROOM 1 Gender: Female Note Status: Finalized Procedure:            Upper GI endoscopy Indications:          Dyspepsia, Follow-up of Barrett's esophagus Providers:            Christena Deem, MD Referring MD:         Barbette Reichmann, MD (Referring MD) Medicines:            Monitored Anesthesia Care Complications:        No immediate complications. Procedure:            Pre-Anesthesia Assessment:                       - ASA Grade Assessment: II - A patient with mild                        systemic disease.                       After obtaining informed consent, the endoscope was                        passed under direct vision. Throughout the procedure,                        the patient's blood pressure, pulse, and oxygen                        saturations were monitored continuously. The Endoscope                        was introduced through the mouth, and advanced to the                        third part of duodenum. The upper GI endoscopy was                        accomplished without difficulty. The patient tolerated                        the procedure well. Findings:      There were esophageal mucosal changes suggestive of short-segment       Barrett's esophagus present in the distal esophagus. The maximum       longitudinal extent of these mucosal changes was 1 cm in length. Mucosa       was biopsied with a cold forceps for histology in a targeted manner and       in 4 quadrants in the lower third of the esophagus. One specimen bottle       was sent to pathology.      Abnormal motility was noted in the mid esophagus and in the distal       esophagus. The cricopharyngeus was normal. There are extra peristaltic       waves in the esophageal body,  intermittant "feline" appearance. The       distal esophagus/lower esophageal sphincter is open. Tertiary       peristaltic waves are noted. Biopsies  were taken with a cold forceps for       histology. Biopsies were taken with a cold forceps for histology 25 cm       from the incisors.      Patchy mild inflammation characterized by erythema was found in the       gastric antrum. Biopsies were taken with a cold forceps for histology.      Diffuse minimal inflammation characterized by congestion (edema) was       found in the gastric body. Biopsies were taken with a cold forceps for       histology.      The cardia and gastric fundus were normal on retroflexion otherwise.      The examined duodenum was normal. Impression:           - Esophageal mucosal changes suggestive of                        short-segment Barrett's esophagus. Biopsied.                       - Abnormal esophageal motility. Biopsied.                       - Bile gastritis. Biopsied.                       - Gastritis. Biopsied.                       - Normal examined duodenum. Recommendation:       - Use Protonix (pantoprazole) 40 mg PO BID daily.                       - Perform a RUQ ultrasound at appointment to be                        scheduled.                       - Perform a HIDA (hepatobiliary iminodiacetic acid)                        scan with CCK at appointment to be scheduled. Christena Deem, MD 12/16/2017 9:54:35 AM This report has been signed electronically. Number of Addenda: 0 Note Initiated On: 12/16/2017 8:55 AM      [facility]

## 2017-12-17 ENCOUNTER — Other Ambulatory Visit: Payer: Self-pay | Admitting: Gastroenterology

## 2017-12-17 DIAGNOSIS — K2960 Other gastritis without bleeding: Secondary | ICD-10-CM

## 2017-12-17 DIAGNOSIS — R1011 Right upper quadrant pain: Secondary | ICD-10-CM

## 2017-12-18 ENCOUNTER — Encounter: Payer: Self-pay | Admitting: Gastroenterology

## 2017-12-20 LAB — SURGICAL PATHOLOGY

## 2017-12-24 ENCOUNTER — Ambulatory Visit
Admission: RE | Admit: 2017-12-24 | Discharge: 2017-12-24 | Disposition: A | Discharge status: Home / Self Care | Payer: 59 | Source: Ambulatory Visit | Attending: Gastroenterology | Admitting: Gastroenterology

## 2017-12-24 DIAGNOSIS — K2960 Other gastritis without bleeding: Secondary | ICD-10-CM | POA: Insufficient documentation

## 2017-12-24 DIAGNOSIS — R1011 Right upper quadrant pain: Secondary | ICD-10-CM | POA: Diagnosis not present

## 2018-01-01 ENCOUNTER — Ambulatory Visit
Admission: RE | Admit: 2018-01-01 | Discharge: 2018-01-01 | Disposition: A | Discharge status: Home / Self Care | Payer: 59 | Source: Ambulatory Visit | Attending: Gastroenterology | Admitting: Gastroenterology

## 2018-01-01 DIAGNOSIS — R1011 Right upper quadrant pain: Secondary | ICD-10-CM | POA: Diagnosis present

## 2018-01-01 DIAGNOSIS — K2960 Other gastritis without bleeding: Secondary | ICD-10-CM | POA: Diagnosis not present

## 2018-01-01 MED ORDER — TECHNETIUM TC 99M MEBROFENIN IV KIT
4.8600 | PACK | Freq: Once | INTRAVENOUS | Status: AC | PRN
  Administered 2018-01-01: 4.86 via INTRAVENOUS

## 2018-01-04 ENCOUNTER — Ambulatory Visit: Payer: 59

## 2019-10-13 IMAGING — NM NM HEPATO W/GB/PHARM/[PERSON_NAME]
2 series · 12 of 12 positions shown · non-contrast
Comparison: Abdominal ultrasound 12/24/2017

CLINICAL DATA: Right upper quadrant pain

EXAM:
NUCLEAR MEDICINE HEPATOBILIARY IMAGING WITH GALLBLADDER EF
TECHNIQUE: Sequential images of the abdomen were obtained [DATE] minutes
following intravenous administration of radiopharmaceutical. After
oral ingestion of Ensure, gallbladder ejection fraction was
determined. At 60 min, normal ejection fraction is greater than 33%.
RADIOPHARMACEUTICALS:  4.86 mCiMebrofenin.

[Series 1000: gallbladder ef · 4.80mm/px · 6 of 120 frames shown]
[frame 11/120]
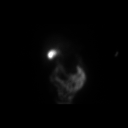
[frame 31/120]
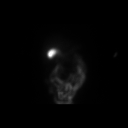
[frame 51/120]
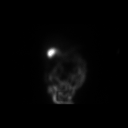
[frame 71/120]
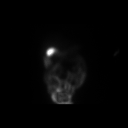
[frame 91/120]
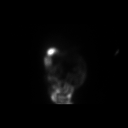
[frame 111/120]
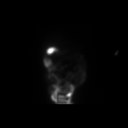

[Series 1000: hepatobiliary scan · 9.59mm/px · 6 of 60 frames shown]
[frame 6/60]
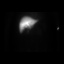
[frame 16/60]
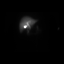
[frame 26/60]
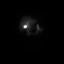
[frame 36/60]
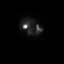
[frame 46/60]
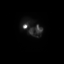
[frame 56/60]
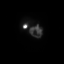

[12 of 12 positions shown; findings below may reference images not displayed]

FINDINGS: Prompt uptake and biliary excretion of activity by the liver is
seen. Gallbladder activity is visualized, consistent with patency of
cystic duct. Biliary activity passes into small bowel, consistent
with patent common bile duct.

Calculated gallbladder ejection fraction is 52%. (Normal gallbladder
ejection fraction with Ensure is greater than 33%.)
IMPRESSION: Normal biliary scintigraphy.

## 2020-08-28 ENCOUNTER — Other Ambulatory Visit: Payer: Self-pay | Admitting: Internal Medicine

## 2020-08-28 DIAGNOSIS — Z1231 Encounter for screening mammogram for malignant neoplasm of breast: Secondary | ICD-10-CM

## 2021-03-11 ENCOUNTER — Ambulatory Visit
Admission: RE | Admit: 2021-03-11 | Discharge: 2021-03-11 | Disposition: A | Discharge status: Home / Self Care | Payer: Managed Care, Other (non HMO) | Source: Ambulatory Visit | Attending: Internal Medicine | Admitting: Internal Medicine

## 2021-03-11 ENCOUNTER — Other Ambulatory Visit: Payer: Self-pay

## 2021-03-11 DIAGNOSIS — Z1231 Encounter for screening mammogram for malignant neoplasm of breast: Secondary | ICD-10-CM | POA: Diagnosis present

## 2021-05-02 ENCOUNTER — Other Ambulatory Visit: Payer: Self-pay

## 2021-05-02 ENCOUNTER — Ambulatory Visit: Payer: Managed Care, Other (non HMO) | Admitting: Certified Registered"

## 2021-05-02 ENCOUNTER — Encounter
Admission: RE | Disposition: A | Discharge status: Home / Self Care | Payer: Self-pay | Source: Home / Self Care | Attending: Gastroenterology

## 2021-05-02 ENCOUNTER — Encounter: Payer: Self-pay | Admitting: *Deleted

## 2021-05-02 ENCOUNTER — Ambulatory Visit
Admission: RE | Admit: 2021-05-02 | Discharge: 2021-05-02 | Disposition: A | Discharge status: Home / Self Care | Payer: Managed Care, Other (non HMO) | Attending: Gastroenterology | Admitting: Gastroenterology

## 2021-05-02 DIAGNOSIS — R1310 Dysphagia, unspecified: Secondary | ICD-10-CM | POA: Diagnosis not present

## 2021-05-02 DIAGNOSIS — F32A Depression, unspecified: Secondary | ICD-10-CM | POA: Insufficient documentation

## 2021-05-02 DIAGNOSIS — K219 Gastro-esophageal reflux disease without esophagitis: Secondary | ICD-10-CM | POA: Diagnosis not present

## 2021-05-02 DIAGNOSIS — F419 Anxiety disorder, unspecified: Secondary | ICD-10-CM | POA: Insufficient documentation

## 2021-05-02 DIAGNOSIS — K2970 Gastritis, unspecified, without bleeding: Secondary | ICD-10-CM | POA: Diagnosis not present

## 2021-05-02 DIAGNOSIS — K2289 Other specified disease of esophagus: Secondary | ICD-10-CM | POA: Diagnosis not present

## 2021-05-02 DIAGNOSIS — K449 Diaphragmatic hernia without obstruction or gangrene: Secondary | ICD-10-CM | POA: Insufficient documentation

## 2021-05-02 HISTORY — PX: ESOPHAGOGASTRODUODENOSCOPY (EGD) WITH PROPOFOL: SHX5813

## 2021-05-02 LAB — POCT PREGNANCY, URINE: Preg Test, Ur: NEGATIVE

## 2021-05-02 SURGERY — ESOPHAGOGASTRODUODENOSCOPY (EGD) WITH PROPOFOL
Anesthesia: General

## 2021-05-02 MED ORDER — SODIUM CHLORIDE 0.9 % IV SOLN: INTRAVENOUS | Status: DC

## 2021-05-02 MED ORDER — PROPOFOL 500 MG/50ML IV EMUL
INTRAVENOUS | Status: AC
Start: 2021-05-02 — End: ?
  Filled 2021-05-02: qty 50

## 2021-05-02 MED ORDER — GLYCOPYRROLATE 0.2 MG/ML IJ SOLN
INTRAMUSCULAR | Status: DC | PRN
Start: 2021-05-02 — End: 2021-05-02
  Administered 2021-05-02: .2 mg via INTRAVENOUS

## 2021-05-02 MED ORDER — PROPOFOL 500 MG/50ML IV EMUL
INTRAVENOUS | Status: DC | PRN
  Administered 2021-05-02: 150 ug/kg/min via INTRAVENOUS

## 2021-05-02 MED ORDER — PROPOFOL 500 MG/50ML IV EMUL
INTRAVENOUS | Status: AC
  Filled 2021-05-02: qty 50

## 2021-05-02 MED ORDER — PROPOFOL 10 MG/ML IV BOLUS
INTRAVENOUS | Status: DC | PRN
  Administered 2021-05-02: 150 mg via INTRAVENOUS

## 2021-05-02 MED ORDER — DEXMEDETOMIDINE HCL IN NACL 400 MCG/100ML IV SOLN
INTRAVENOUS | Status: DC | PRN
  Administered 2021-05-02: 12 ug via INTRAVENOUS

## 2021-05-02 NOTE — Transfer of Care (Addendum)
Immediate Anesthesia Transfer of Care Note  Patient: Samantha Yu  Procedure(s) Performed: ESOPHAGOGASTRODUODENOSCOPY (EGD) WITH PROPOFOL  Patient Location: PACU  Anesthesia Type:General  Level of Consciousness: awake  Airway & Oxygen Therapy: Patient Spontanous Breathing  Post-op Assessment: Report given to RN  Post vital signs: stable  Last Vitals:  Vitals Value Taken Time  BP 117/71 05/02/21 1050  Temp 36 C 05/02/21 1050  Pulse 75 05/02/21 1050  Resp 20 05/02/21 1050  SpO2 99 % 05/02/21 1050    Last Pain:  Vitals:   05/02/21 1050  TempSrc: Temporal  PainSc: 0-No pain         Complications: No notable events documented.

## 2021-05-02 NOTE — H&P (Signed)
Outpatient short stay form Pre-procedure 05/02/2021  Samantha Rubenstein, MD  Primary Physician: Tracie Harrier, MD  Reason for visit:  GERD/Dysphagia to solids and liquids  History of present illness:    41 y/o lady with history of GERD here for EGD for reported history of BE's and new onset dysphagia to solids and liquids that happens very infrequently, maybe once a month. GERD controlled as long as she takes her medicine. No blood thinners. No family history of GI malignancies. No neck surgeries.    Current Facility-Administered Medications:    0.9 %  sodium chloride infusion, , Intravenous, Continuous, Reeda Soohoo, Hilton Cork, MD, Last Rate: 20 mL/hr at 05/02/21 1102, New Bag at 05/02/21 1102  Medications Prior to Admission  Medication Sig Dispense Refill Last Dose   acetaminophen (TYLENOL) 100 MG/ML solution Take 10 mg/kg by mouth every 4 (four) hours as needed for fever.   05/01/2021   ALPRAZolam (XANAX) 0.5 MG tablet Take 0.5 mg by mouth at bedtime as needed for anxiety.   Past Month   buPROPion (WELLBUTRIN SR) 150 MG 12 hr tablet Take 150 mg by mouth 2 (two) times daily.   05/01/2021   clindamycin (CLINDAGEL) 1 % gel Apply topically daily.   Past Month   hyoscyamine (LEVBID) 0.375 MG 12 hr tablet Take 0.375 mg by mouth every 12 (twelve) hours as needed for cramping.   05/01/2021   sertraline (ZOLOFT) 100 MG tablet Take 100 mg by mouth daily.   05/01/2021   traZODone (DESYREL) 50 MG tablet Take 50 mg by mouth at bedtime.   Past Week   cetirizine (ZYRTEC) 10 MG tablet Take 10 mg by mouth daily. (Patient not taking: Reported on 05/02/2021)   Not Taking   colchicine 0.6 MG tablet Take 0.6 mg by mouth daily. (Patient not taking: Reported on 05/02/2021)   Not Taking   Cyanocobalamin 1000 MCG/ML KIT Inject 1,000 mcg as directed every 30 (thirty) days. (Patient not taking: Reported on 05/02/2021)   Not Taking   cyclobenzaprine (FLEXERIL) 5 MG tablet Take 5 mg by mouth 3 (three) times daily as  needed for muscle spasms. (Patient not taking: Reported on 05/02/2021)   Not Taking   dicyclomine (BENTYL) 20 MG tablet Take 1 tablet (20 mg total) by mouth 3 (three) times daily as needed (abdominal pain). (Patient not taking: Reported on 05/02/2021) 30 tablet 0 Not Taking   etonogestrel (NEXPLANON) 68 MG IMPL implant 1 each by Subdermal route once.      fluticasone (FLONASE) 50 MCG/ACT nasal spray Place 1 spray into both nostrils 2 (two) times daily. (Patient not taking: Reported on 05/02/2021)   Not Taking   LORazepam (ATIVAN) 0.5 MG tablet Take 0.5 mg by mouth every 8 (eight) hours. (Patient not taking: Reported on 05/02/2021)   Not Taking   ondansetron (ZOFRAN) 4 MG tablet Take 4 mg by mouth every 6 (six) hours as needed for nausea or vomiting. (Patient not taking: Reported on 05/02/2021)   Not Taking   pantoprazole (PROTONIX) 40 MG tablet Take 40 mg by mouth daily. (Patient not taking: Reported on 05/02/2021)   Not Taking   prochlorperazine (COMPAZINE) 10 MG tablet Take 1 tablet (10 mg total) by mouth every 8 (eight) hours as needed for nausea (headache). (Patient not taking: Reported on 05/02/2021) 20 tablet 0 Not Taking   traMADol (ULTRAM) 50 MG tablet Take 50 mg by mouth 2 (two) times daily.      vitamin A (AQUASOL-A) 50000 units/mL injection Inject into the  muscle daily. (Patient not taking: Reported on 05/02/2021)   Not Taking   Vitamin D, Ergocalciferol, (DRISDOL) 50000 units CAPS capsule Take 50,000 Units by mouth every 7 (seven) days. (Patient not taking: Reported on 05/02/2021)   Not Taking     Allergies  Allergen Reactions   Cheratussin Ac [Guaifenesin-Codeine]    Lyrica [Pregabalin]    Nsaids      Past Medical History:  Diagnosis Date   Anxiety    Barrett's esophagus    Chicken pox    Depression    Gastritis    GERD (gastroesophageal reflux disease)    History of chickenpox    IBS (irritable bowel syndrome)    Obesity    Obesity    Vitamin B 12 deficiency    Vitamin D  deficiency     Review of systems:  Otherwise negative.    Physical Exam  Gen: Alert, oriented. Appears stated age.  HEENT: PERRLA. Lungs: No respiratory distress CV: RRR Abd: soft, benign, no masses Ext: No edema    Planned procedures: Proceed with EGD. The patient understands the nature of the planned procedure, indications, risks, alternatives and potential complications including but not limited to bleeding, infection, perforation, damage to internal organs and possible oversedation/side effects from anesthesia. The patient agrees and gives consent to proceed.  Please refer to procedure notes for findings, recommendations and patient disposition/instructions.     Samantha Rubenstein, MD [facility] Gastroenterology

## 2021-05-02 NOTE — Interval H&P Note (Signed)
History and Physical Interval Note:  05/02/2021 11:24 AM  Samantha Yu  has presented today for surgery, with the diagnosis of GERD, PHARYNGOESOPHAGEAL DYSPHAGIA.  The various methods of treatment have been discussed with the patient and family. After consideration of risks, benefits and other options for treatment, the patient has consented to  Procedure(s): ESOPHAGOGASTRODUODENOSCOPY (EGD) WITH PROPOFOL (N/A) as a surgical intervention.  The patient's history has been reviewed, patient examined, no change in status, stable for surgery.  I have reviewed the patient's chart and labs.  Questions were answered to the patient's satisfaction.     Regis Bill  Ok to proceed with EGD

## 2021-05-02 NOTE — Anesthesia Postprocedure Evaluation (Signed)
Anesthesia Post Note  Patient: Samantha Yu  Procedure(s) Performed: ESOPHAGOGASTRODUODENOSCOPY (EGD) WITH PROPOFOL  Patient location during evaluation: Endoscopy Anesthesia Type: General Level of consciousness: awake and alert Pain management: pain level controlled Vital Signs Assessment: post-procedure vital signs reviewed and stable Respiratory status: spontaneous breathing, nonlabored ventilation, respiratory function stable and patient connected to nasal cannula oxygen Cardiovascular status: blood pressure returned to baseline and stable Postop Assessment: no apparent nausea or vomiting Anesthetic complications: no   No notable events documented.   Last Vitals:  Vitals:   05/02/21 1200 05/02/21 1210  BP: 107/65 108/75  Pulse: 89 81  Resp: 17 13  Temp:    SpO2: 98% 98%    Last Pain:  Vitals:   05/02/21 1140  TempSrc: Temporal  PainSc:                  Corinda Gubler

## 2021-05-02 NOTE — Op Note (Signed)
[facility] Gastroenterology Patient Name: Samantha Yu Procedure Date: 05/02/2021 11:17 AM MRN: [id number] Account #: [id number] Date of Birth: 23-Mar-1980 Admit Type: Outpatient Age: 41 Room: [facility] ENDO ROOM 3 Gender: Female Note Status: Finalized Instrument Name: Upper Endoscope [id number] Procedure:             Upper GI endoscopy Indications:           Dysphagia, Gastro-esophageal reflux disease Providers:             Andrey Farmer MD, MD Medicines:             Monitored Anesthesia Care Complications:         No immediate complications. Estimated blood loss:                         Minimal. Procedure:             Pre-Anesthesia Assessment:                        - Prior to the procedure, a History and Physical was                         performed, and patient medications and allergies were                         reviewed. The patient is competent. The risks and                         benefits of the procedure and the sedation options and                         risks were discussed with the patient. All questions                         were answered and informed consent was obtained.                         Patient identification and proposed procedure were                         verified by the physician, the nurse, the                         anesthesiologist, the anesthetist and the technician                         in the endoscopy suite. Mental Status Examination:                         alert and oriented. Airway Examination: normal                         oropharyngeal airway and neck mobility. Respiratory                         Examination: clear to auscultation. CV Examination:                         normal. Prophylactic Antibiotics: The patient does not  require prophylactic antibiotics. Prior                         Anticoagulants: The patient has taken no previous                         anticoagulant or antiplatelet  agents. ASA Grade                         Assessment: II - A patient with mild systemic disease.                         After reviewing the risks and benefits, the patient                         was deemed in satisfactory condition to undergo the                         procedure. The anesthesia plan was to use monitored                         anesthesia care (MAC). Immediately prior to                         administration of medications, the patient was                         re-assessed for adequacy to receive sedatives. The                         heart rate, respiratory rate, oxygen saturations,                         blood pressure, adequacy of pulmonary ventilation, and                         response to care were monitored throughout the                         procedure. The physical status of the patient was                         re-assessed after the procedure.                        After obtaining informed consent, the endoscope was                         passed under direct vision. Throughout the procedure,                         the patient's blood pressure, pulse, and oxygen                         saturations were monitored continuously. The Endoscope                         was introduced through the mouth, and advanced to the  second part of duodenum. The upper GI endoscopy was                         accomplished without difficulty. The patient tolerated                         the procedure well. Findings:      A small hiatal hernia was present.      Normal mucosa was found in the entire esophagus. Biopsies were obtained       from the proximal and distal esophagus with cold forceps for histology       of suspected eosinophilic esophagitis. Estimated blood loss was minimal.      The esophagus and gastroesophageal junction were examined with white       light and narrow band imaging (NBI). There was no visual evidence of        Barrett's esophagus.      Patchy mild inflammation characterized by adherent blood was found in       the gastric fundus. Biopsies were taken with a cold forceps for       Helicobacter pylori testing. Estimated blood loss was minimal.      The exam of the stomach was otherwise normal.      The examined duodenum was normal. Impression:            - Small hiatal hernia.                        - Normal mucosa was found in the entire esophagus.                         Biopsied.                        - There is no endoscopic evidence of Barrett's                         esophagus.                        - Gastritis. Biopsied.                        - Normal examined duodenum. Recommendation:        - Discharge patient to home.                        - Resume previous diet.                        - Continue present medications.                        - Await pathology results.                        - Return to referring physician as previously                         scheduled. Procedure Code(s):     --- Professional ---                        [id number],  Esophagogastroduodenoscopy, flexible,                         transoral; with biopsy, single or multiple Diagnosis Code(s):     --- Professional ---                        K44.9, Diaphragmatic hernia without obstruction or                         gangrene                        K29.70, Gastritis, unspecified, without bleeding                        R13.10, Dysphagia, unspecified                        K21.9, Gastro-esophageal reflux disease without                         esophagitis CPT copyright 2019 [employer] Association. All rights reserved. The codes documented in this report are preliminary and upon coder review may  be revised to meet current compliance requirements. Andrey Farmer MD, MD 05/02/2021 11:47:20 AM Number of Addenda: 0 Note Initiated On: 05/02/2021 11:17 AM Estimated Blood Loss:  Estimated blood loss was  minimal.      [facility]

## 2021-05-02 NOTE — Anesthesia Preprocedure Evaluation (Signed)
Anesthesia Evaluation  Patient identified by MRN, date of birth, ID band Patient awake    Reviewed: Allergy & Precautions, NPO status , Patient's Chart, lab work & pertinent test results  History of Anesthesia Complications Negative for: history of anesthetic complications  Airway Mallampati: II  TM Distance: >3 FB Neck ROM: Full    Dental no notable dental hx. (+) Teeth Intact   Pulmonary neg sleep apnea, neg COPD, Current Smoker and Patient abstained from smoking.,    Pulmonary exam normal breath sounds clear to auscultation       Cardiovascular Exercise Tolerance: Good METS(-) hypertension(-) CAD and (-) Past MI negative cardio ROS  (-) dysrhythmias  Rhythm:Regular Rate:Normal - Systolic murmurs    Neuro/Psych PSYCHIATRIC DISORDERS Anxiety Depression negative neurological ROS     GI/Hepatic GERD  ,(+)     (-) substance abuse  ,   Endo/Other  neg diabetes  Renal/GU negative Renal ROS     Musculoskeletal   Abdominal   Peds  Hematology   Anesthesia Other Findings Past Medical History: No date: Anxiety No date: Barrett's esophagus No date: Chicken pox No date: Depression No date: Gastritis No date: GERD (gastroesophageal reflux disease) No date: History of chickenpox No date: IBS (irritable bowel syndrome) No date: Obesity No date: Obesity No date: Vitamin B 12 deficiency No date: Vitamin D deficiency  Reproductive/Obstetrics                             Anesthesia Physical Anesthesia Plan  ASA: 2  Anesthesia Plan: General   Post-op Pain Management: Minimal or no pain anticipated   Induction: Intravenous  PONV Risk Score and Plan: 3 and Propofol infusion, TIVA and Ondansetron  Airway Management Planned: Nasal Cannula  Additional Equipment: None  Intra-op Plan:   Post-operative Plan:   Informed Consent: I have reviewed the patients History and Physical, chart,  labs and discussed the procedure including the risks, benefits and alternatives for the proposed anesthesia with the patient or authorized representative who has indicated his/her understanding and acceptance.     Dental advisory given  Plan Discussed with: CRNA and Surgeon  Anesthesia Plan Comments: (Discussed risks of anesthesia with patient, including possibility of difficulty with spontaneous ventilation under anesthesia necessitating airway intervention, PONV, and rare risks such as cardiac or respiratory or neurological events, and allergic reactions. Discussed the role of CRNA in patient's perioperative care. Patient understands.)        Anesthesia Quick Evaluation

## 2021-05-05 ENCOUNTER — Encounter: Payer: Self-pay | Admitting: Gastroenterology

## 2021-05-06 LAB — SURGICAL PATHOLOGY

## 2022-04-10 ENCOUNTER — Other Ambulatory Visit: Payer: Self-pay | Admitting: Obstetrics and Gynecology

## 2022-04-10 DIAGNOSIS — Z1231 Encounter for screening mammogram for malignant neoplasm of breast: Secondary | ICD-10-CM

## 2023-06-15 ENCOUNTER — Other Ambulatory Visit: Payer: Self-pay | Admitting: Internal Medicine

## 2023-06-15 DIAGNOSIS — Z1231 Encounter for screening mammogram for malignant neoplasm of breast: Secondary | ICD-10-CM
